# Patient Record
Sex: Male | Born: 1970 | Race: Black or African American | Hispanic: Yes | Marital: Married
Health system: Southern US, Community
[De-identification: ages and names within clinical notes are randomized; demographics above are authoritative.]

---

## 2008-12-09 ENCOUNTER — Emergency Department (HOSPITAL_COMMUNITY): Admission: EM | Admit: 2008-12-09 | Discharge: 2008-12-09 | Payer: Self-pay | Admitting: Emergency Medicine

## 2009-01-12 ENCOUNTER — Emergency Department (HOSPITAL_COMMUNITY): Admission: EM | Admit: 2009-01-12 | Discharge: 2009-01-12 | Payer: Self-pay | Admitting: Emergency Medicine

## 2009-01-13 ENCOUNTER — Emergency Department (HOSPITAL_COMMUNITY): Admission: EM | Admit: 2009-01-13 | Discharge: 2009-01-13 | Payer: Self-pay | Admitting: Family Medicine

## 2010-05-03 IMAGING — CR DG CHEST 2V
2 series · 2 of 2 positions shown · non-contrast
Comparison: None

CLINICAL DATA: Right-sided abdominal pain

CHEST - 2 VIEW

[view not recorded (1 of 2)]
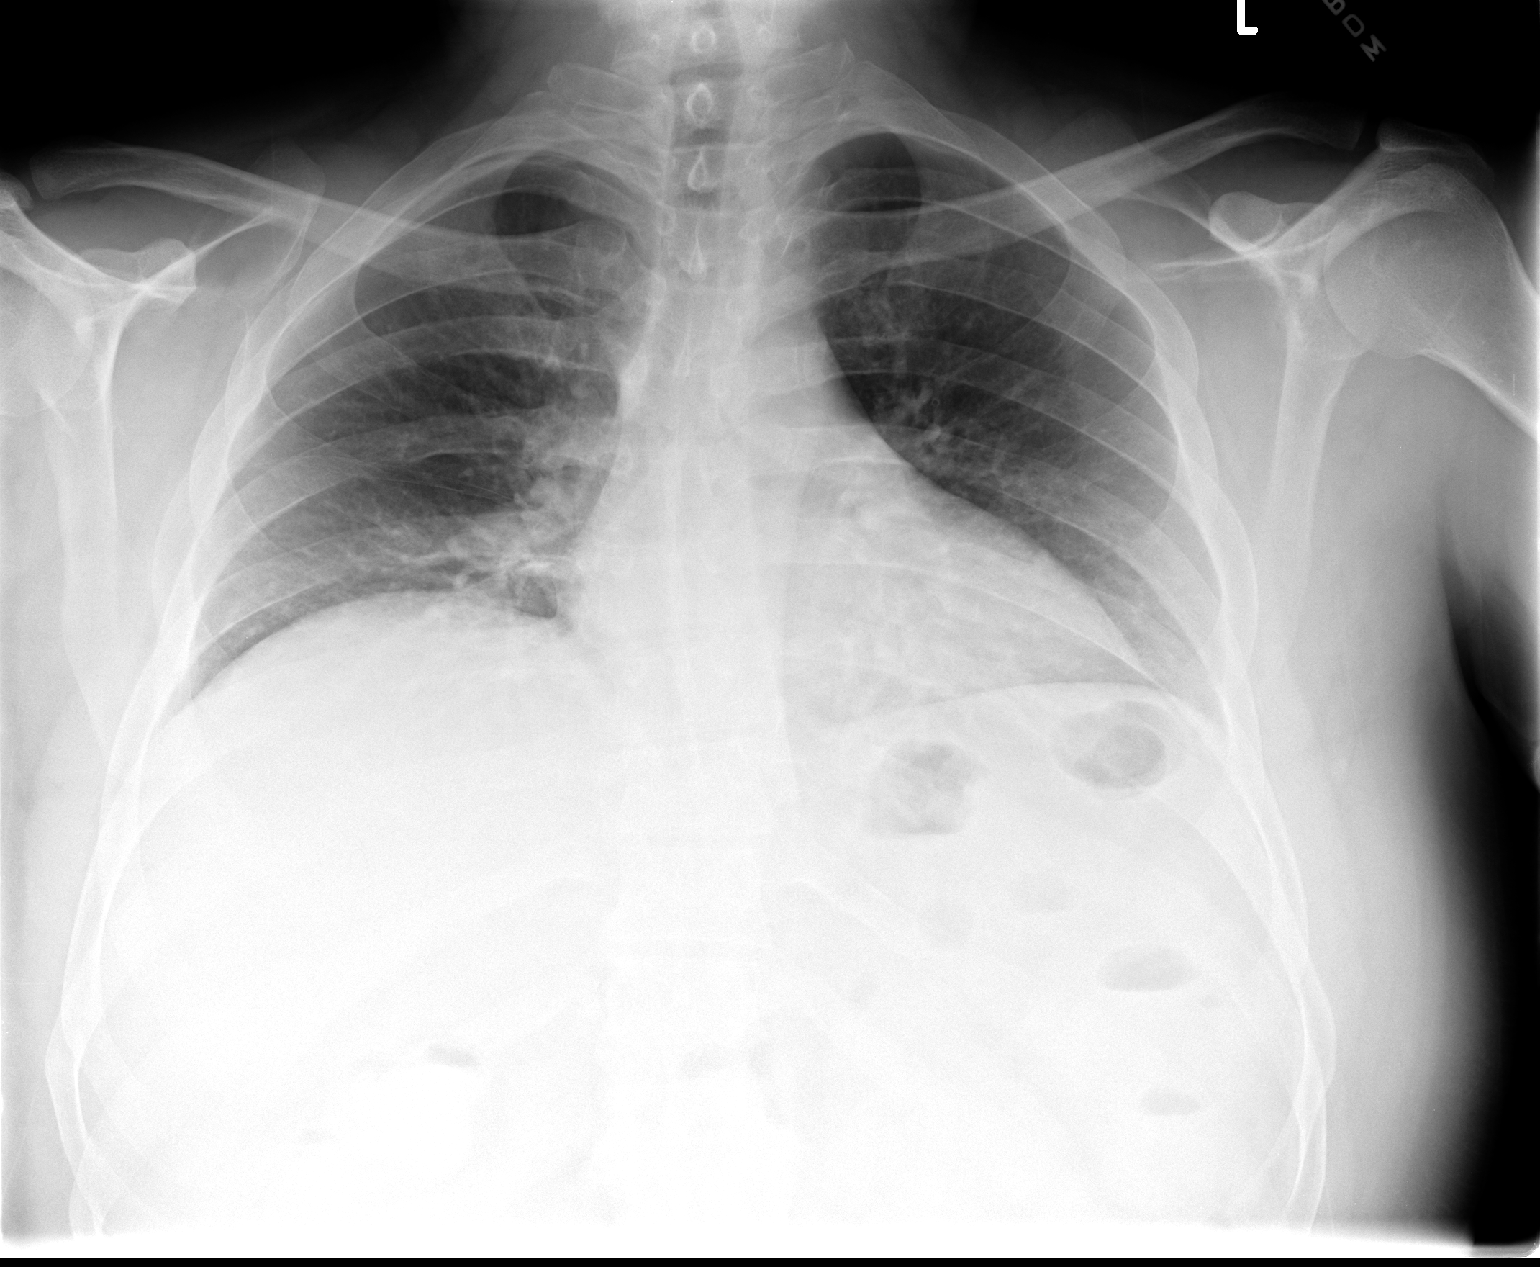

[view not recorded (2 of 2)]
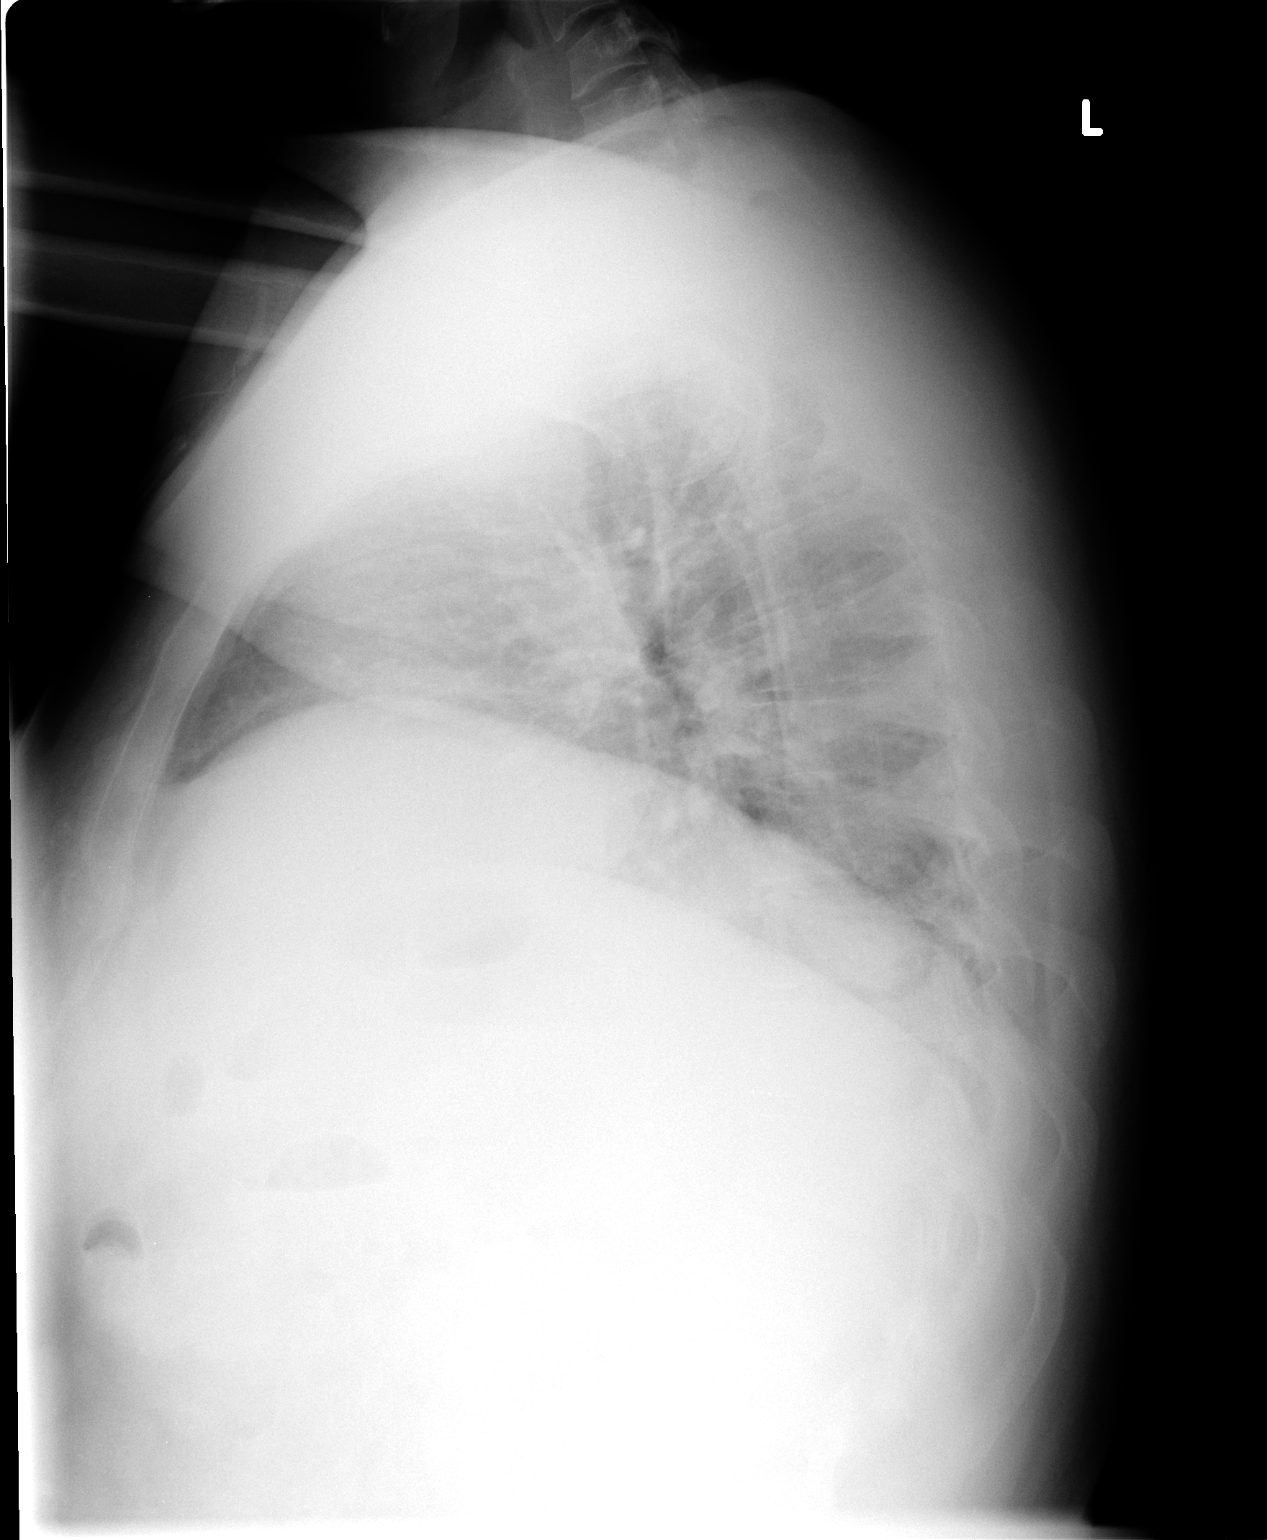

[2 of 2 positions shown; findings below may reference images not displayed]

FINDINGS: The lungs are not well aerated with mild basilar
atelectasis present.  No focal infiltrate or effusion is seen.  The
heart is within normal limits in size.
IMPRESSION: Poor aeration.  Mild basilar atelectasis.

## 2010-09-11 LAB — COMPREHENSIVE METABOLIC PANEL
ALT: 27 U/L (ref 0–53)
AST: 23 U/L (ref 0–37)
CO2: 26 mEq/L (ref 19–32)
Calcium: 9.3 mg/dL (ref 8.4–10.5)
Chloride: 99 mEq/L (ref 96–112)
GFR calc Af Amer: >60 mL/min (ref >60)
GFR calc non Af Amer: >60 mL/min (ref >60)
Potassium: 3.9 mEq/L (ref 3.5–5.1)
Sodium: 133 mEq/L — ABNORMAL LOW (ref 135–145)

## 2010-09-11 LAB — CBC
HCT: 45.5 % (ref 39.0–52.0)
MCHC: 33.8 g/dL (ref 30.0–36.0)
MCV: 90.1 fL (ref 78.0–100.0)
Platelets: 284 10*3/uL (ref 150–400)
RBC: 5.22 MIL/uL (ref 4.22–5.81)
RDW: 12.8 % (ref 11.5–15.5)
WBC: 12.3 10*3/uL — ABNORMAL HIGH (ref 4.0–10.5)

## 2010-09-11 LAB — POCT I-STAT, CHEM 8
HCT: 48 % (ref 39.0–52.0)
Hemoglobin: 16.3 g/dL (ref 13.0–17.0)
Sodium: 138 mEq/L (ref 135–145)
TCO2: 26 mmol/L (ref 0–100)

## 2010-09-11 LAB — DIFFERENTIAL
Basophils Absolute: 0 10*3/uL (ref 0.0–0.1)
Eosinophils Absolute: 0.1 10*3/uL (ref 0.0–0.7)
Eosinophils Absolute: 0.2 10*3/uL (ref 0.0–0.7)
Eosinophils Relative: 1 % (ref 0–5)
Eosinophils Relative: 2 % (ref 0–5)
Lymphs Abs: 2 10*3/uL (ref 0.7–4.0)
Neutrophils Relative %: 78 % — ABNORMAL HIGH (ref 43–77)

## 2010-09-11 LAB — LIPASE, BLOOD: Lipase: 21 U/L (ref 11–59)

## 2018-04-10 ENCOUNTER — Emergency Department (HOSPITAL_COMMUNITY)
Admission: EM | Admit: 2018-04-10 | Discharge: 2018-04-10 | Disposition: A | Discharge status: Home / Self Care | Payer: Self-pay | Attending: Emergency Medicine | Admitting: Emergency Medicine

## 2018-04-10 ENCOUNTER — Encounter (HOSPITAL_COMMUNITY): Payer: Self-pay | Admitting: Emergency Medicine

## 2018-04-10 DIAGNOSIS — T63441A Toxic effect of venom of bees, accidental (unintentional), initial encounter: Secondary | ICD-10-CM | POA: Insufficient documentation

## 2018-04-10 MED ORDER — FAMOTIDINE 20 MG PO TABS: 20.0000 mg | ORAL_TABLET | Freq: Two times a day (BID) | ORAL | 0 refills | Status: AC

## 2018-04-10 MED ORDER — PREDNISONE 10 MG (21) PO TBPK: ORAL_TABLET | ORAL | 0 refills | Status: AC

## 2018-04-10 MED ORDER — PREDNISONE 20 MG PO TABS
60.0000 mg | ORAL_TABLET | Freq: Once | ORAL | Status: AC
Start: 2018-04-10 — End: 2018-04-10
  Administered 2018-04-10: 60 mg via ORAL
  Filled 2018-04-10: qty 3

## 2018-04-10 MED ORDER — EPINEPHRINE 0.3 MG/0.3ML IJ SOAJ: 0.3000 mg | Freq: Once | INTRAMUSCULAR | 1 refills | Status: AC

## 2018-04-10 MED ORDER — FAMOTIDINE 20 MG PO TABS
20.0000 mg | ORAL_TABLET | Freq: Once | ORAL | Status: AC
  Administered 2018-04-10: 20 mg via ORAL
  Filled 2018-04-10: qty 1

## 2018-04-10 MED ORDER — DIPHENHYDRAMINE HCL 25 MG PO CAPS
25.0000 mg | ORAL_CAPSULE | Freq: Once | ORAL | Status: AC
  Administered 2018-04-10: 25 mg via ORAL
  Filled 2018-04-10: qty 1

## 2018-04-10 MED ORDER — DIPHENHYDRAMINE HCL 25 MG PO TABS: 25.0000 mg | ORAL_TABLET | Freq: Four times a day (QID) | ORAL | 0 refills | Status: AC

## 2018-04-10 NOTE — ED Provider Notes (Signed)
[facility] EMERGENCY DEPARTMENT Provider Note   CSN: [id number] Arrival date & time: 04/10/18  4098     History   Chief Complaint Chief Complaint  Patient presents with  . Insect Bite  . Allergic Reaction    HPI Thomas Neal is a 47 y.o. male.  The history is provided by the patient. A language interpreter was used ([place]).  Allergic Reaction     Thomas Neal is a 47 y.o. male, patient with no pertinent past medical history, presenting to the ED for evaluation following multiple bee stings that occurred around 8 AM this morning.  He notes 3 or 4 stings to his head and one to his right side.  He has pain in the region of the stings.  Initially, patient states he felt lightheaded and had some tingling throughout his body, including his lips.  This has all improved.  Denies syncope, shortness of breath, chest pain, abdominal pain, N/V/D, swelling to the face, lips, or tongue, rash/hives, or any other complaints.   History reviewed. No pertinent past medical history.  There are no active problems to display for this patient.   History reviewed. No pertinent surgical history.      Home Medications    Prior to Admission medications   Medication Sig Start Date End Date Taking? Authorizing Provider  diphenhydrAMINE (BENADRYL) 25 MG tablet Take 1 tablet (25 mg total) by mouth every 6 (six) hours for 1 day. 04/10/18 04/11/18  Kenyata Guess C, PA-C  EPINEPHrine 0.3 mg/0.3 mL IJ SOAJ injection Inject 0.3 mLs (0.3 mg total) into the muscle once for 1 dose. 04/10/18 04/10/18  Javonta Gronau C, PA-C  famotidine (PEPCID) 20 MG tablet Take 1 tablet (20 mg total) by mouth 2 (two) times daily for 3 days. 04/10/18 04/13/18  Omya Winfield C, PA-C  predniSONE (STERAPRED UNI-PAK 21 TAB) 10 MG (21) TBPK tablet Take 6 tabs (60mg ) day 1, 5 tabs (50mg ) day 2, 4 tabs (40mg ) day 3, 3 tabs (30mg ) day 4, 2 tabs (20mg ) day 5, and 1 tab (10mg ) day 6. 04/10/18   Kennetta Pavlovic, Hillard Danker, PA-C    Family  History No family history on file.  Social History Social History   Tobacco Use  . Smoking status: Not on file  Substance Use Topics  . Alcohol use: Not on file  . Drug use: Not on file     Allergies   Patient has no known allergies.   Review of Systems Review of Systems  HENT: Negative for facial swelling.   Respiratory: Negative for cough and shortness of breath.   Cardiovascular: Negative for chest pain.  Gastrointestinal: Negative for abdominal pain, diarrhea, nausea and vomiting.  Skin:       Bee stings  Neurological: Positive for light-headedness (resolved). Negative for weakness, numbness and headaches.       Tingling  All other systems reviewed and are negative.    Physical Exam Updated Vital Signs BP (!) 158/104 (BP Location: Right Arm)   Pulse 88   Temp 98.2 F (36.8 C) (Oral)   Resp 16   SpO2 98%   Physical Exam  Constitutional: He appears well-developed and well-nourished. No distress.  HENT:  Head: Normocephalic.  Mouth/Throat: Oropharynx is clear and moist.  No noted swelling to the face or angioedema.  Eyes: Conjunctivae are normal.  Neck: Neck supple.  Cardiovascular: Normal rate, regular rhythm, normal heart sounds and intact distal pulses.  Pulmonary/Chest: Effort normal and breath sounds normal. No respiratory distress.  No increased work of breathing.  Speaks in full sentences without difficulty.  Abdominal: Soft. There is no tenderness. There is no guarding.  Musculoskeletal: He exhibits no edema.  Lymphadenopathy:    He has no cervical adenopathy.  Neurological: He is alert.  Skin: Skin is warm and dry. He is not diaphoretic.  Patient has 3 small, erythematous, tender bumps on his scalp.  No noted surrounding swelling or edema.  No noted retained stingers. Same presentation to patient's right side. No noted hives.  Psychiatric: He has a normal mood and affect. His behavior is normal.  Nursing note and vitals reviewed.    ED  Treatments / Results  Labs (all labs ordered are listed, but only abnormal results are displayed) Labs Reviewed - No data to display  EKG None  Radiology No results found.  Procedures Procedures (including critical care time)  Medications Ordered in ED Medications  famotidine (PEPCID) tablet 20 mg (20 mg Oral Given 04/10/18 1104)  diphenhydrAMINE (BENADRYL) capsule 25 mg (25 mg Oral Given 04/10/18 1103)  predniSONE (DELTASONE) tablet 60 mg (60 mg Oral Given 04/10/18 1104)     Initial Impression / Assessment and Plan / ED Course  I have reviewed the triage vital signs and the nursing notes.  Pertinent labs & imaging results that were available during my care of the patient were reviewed by me and considered in my medical decision making (see chart for details).     Patient presents for evaluation following multiple bee stings.  He does not have symptoms currently consistent with anaphylaxis.  We will treat for possible mild allergic reaction. The patient was given instructions for home care as well as return precautions. Patient voices understanding of these instructions, accepts the plan, and is comfortable with discharge.  Vitals:   04/10/18 0928 04/10/18 1006 04/10/18 1102  BP: (!) 154/96 (!) 158/104 (!) 151/98  Pulse: 92 88 88  Resp: 18 16 16   Temp: 98.2 F (36.8 C)    TempSrc: Oral    SpO2: 100% 98% 100%   Patient's hypertension is noted.  He does not seem to be symptomatic to it at this time.  We discussed follow-up with a PCP regarding this issue.      Final Clinical Impressions(s) / ED Diagnoses   Final diagnoses:  Bee sting, accidental or unintentional, initial encounter    ED Discharge Orders         Ordered    diphenhydrAMINE (BENADRYL) 25 MG tablet  Every 6 hours     04/10/18 1057    famotidine (PEPCID) 20 MG tablet  2 times daily     04/10/18 1057    predniSONE (STERAPRED UNI-PAK 21 TAB) 10 MG (21) TBPK tablet     04/10/18 1057    EPINEPHrine 0.3  mg/0.3 mL IJ SOAJ injection   Once     04/10/18 1057           Anselm Pancoast, PA-C 04/10/18 1112    Vanetta Mulders, MD 04/13/18 1300

## 2018-04-10 NOTE — Discharge Instructions (Addendum)
Allergic Reaction Instructions:  Benadryl: Take 25 mg of Benadryl every 6 hours for the next 24 hours.  Use caution as Benadryl can make you drowsy. Pepcid: Take the Pepcid, as prescribed, over the next 3 days. Prednisone: Take prednisone, as prescribed, until finished. EpiPen: You have been prescribed an EpiPen to be used in the case of anaphylaxis. Please see the attached sheets regarding the symptoms that would cause you to have to use the EpiPen. If you have to use the EpiPen, you should always come to the emergency room immediately.  Follow-up with your primary care provider on this matter.  Allergy testing with an allergist may be warranted.  Return to the ED for worsening symptoms, shortness of breath, chest pain, palpitations, persistent vomiting, facial or throat swelling, or any other major concerns.  Your blood pressure was noted to be higher than normal today.  This should be followed up upon by a primary care provider.

## 2018-04-10 NOTE — ED Triage Notes (Signed)
Pt was stung by 3 bees 1 hour ago and began to having hives all over trunk with itching and was having trouble breathing, pt called ems pt states by the time ems got there he was having no trouble breathing, pt denies any chest pain. Currently pt just has itching and hives. Pt is alert and ox4. No distress at this time.
